# Patient Record
Sex: Female | Born: 1995 | Race: White | Hispanic: Yes | Marital: Single
Health system: Southern US, Community
[De-identification: ages and names within clinical notes are randomized; demographics above are authoritative.]

## PROBLEM LIST (undated history)

## (undated) DIAGNOSIS — T7840XA Allergy, unspecified, initial encounter: Secondary | ICD-10-CM

## (undated) DIAGNOSIS — J45909 Unspecified asthma, uncomplicated: Secondary | ICD-10-CM

## (undated) HISTORY — DX: Unspecified asthma, uncomplicated: J45.909

## (undated) HISTORY — DX: Allergy, unspecified, initial encounter: T78.40XA

---
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
## 2016-01-28 ENCOUNTER — Telehealth: Payer: Self-pay | Admitting: Family Medicine

## 2016-01-29 NOTE — Telephone Encounter (Signed)
Pt aware to come pick up labs

## 2017-04-22 ENCOUNTER — Telehealth: Payer: Self-pay | Admitting: Family Medicine

## 2017-04-22 NOTE — Telephone Encounter (Signed)
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 Appt requested with Dr Darlyn ReadStacks because that is who mother is seeing as well. Appt given

## 2017-04-24 ENCOUNTER — Ambulatory Visit (INDEPENDENT_AMBULATORY_CARE_PROVIDER_SITE_OTHER): Admitting: Family Medicine

## 2017-04-24 ENCOUNTER — Encounter: Payer: Self-pay | Admitting: Family Medicine

## 2017-04-24 VITALS — BP 115/79 | HR 83 | Temp 98.8°F | Ht 65.7 in | Wt 128.0 lb

## 2017-04-24 DIAGNOSIS — B89 Unspecified parasitic disease: Secondary | ICD-10-CM | POA: Diagnosis not present

## 2017-04-24 MED ORDER — ALBENDAZOLE 200 MG PO TABS: 400.0000 mg | ORAL_TABLET | Freq: Two times a day (BID) | ORAL | 0 refills | Status: DC

## 2017-04-24 NOTE — Progress Notes (Signed)
Chief Complaint  Patient presents with  . Advice Only    pt here today to discuss parasites as her mother thinks everyone in the house is infested     HPI  Patient presents today for Questionable parasite infection. She was recently noted to have deficiency of vitamins. This was through blood work done at her rheumatologist's office. Additionally there is a known infestation of parasites in the soil of [place] on [place] she and [place] family live. She admits to going barefoot in that area routinely and also digging in the dirt plant flowers etc. She has a history of some irritable bowel symptoms in the past.  PMH: Smoking status noted ROS: Per HPI  Objective: BP 115/79   Pulse 83   Temp 98.8 F (37.1 C) (Oral)   Ht 5' 5.7" (1.669 m)   Wt 128 lb (58.1 kg)   BMI 20.85 kg/m  Gen: NAD, alert, cooperative with exam HEENT: NCAT, EOMI, PERRL CV: RRR, good S1/S2, no murmur Resp: CTABL, no wheezes, non-labored Abd: SNTND, BS present, no guarding or organomegaly Ext: No edema, warm Neuro: Alert and oriented, No gross deficits  Assessment and plan:  1. Parasite infestation     Meds ordered this encounter  Medications  . gabapentin (NEURONTIN) 300 MG capsule    Sig: Take 300 mg in AM and 300 mg in afternoon and 600 mg before bed  . drospirenone-ethinyl estradiol (YASMIN 28) 3-0.03 MG tablet    Sig: drospirenone 3 mg-ethinyl estradiol 0.03 mg tablet  TAKE 1 TABLET BY MOUTH EVERY DAY  . DISCONTD: albendazole (ALBENZA) 200 MG tablet    Sig: Take 2 tablets (400 mg total) by mouth 2 (two) times daily.    Dispense:  84 tablet    Refill:  0  . albendazole (ALBENZA) 200 MG tablet    Sig: Take 2 tablets (400 mg total) by mouth 2 (two) times daily.    Dispense:  84 tablet    Refill:  0    Orders Placed This Encounter  Procedures  . Giardia/Cryptosporidium EIA  . Cdiff NAA+O+P+Stool Culture    Follow up as needed.  Mechele ClaudeWarren Cherrise Occhipinti, MD

## 2017-04-28 ENCOUNTER — Telehealth: Payer: Self-pay | Admitting: Family Medicine

## 2017-04-28 NOTE — Telephone Encounter (Signed)
She found the papers. Disregard this message

## 2017-05-04 ENCOUNTER — Telehealth: Payer: Self-pay | Admitting: *Deleted

## 2017-05-05 MED ORDER — ALBENDAZOLE 200 MG PO TABS: 400.0000 mg | ORAL_TABLET | Freq: Once | ORAL | 0 refills | Status: AC

## 2017-05-05 NOTE — Telephone Encounter (Signed)
Mom says [place], very worried some of ongoing symptoms related. Will send in albendazole 400mg  x 1. Needs to be seen for any further treatment.

## 2017-05-06 ENCOUNTER — Telehealth: Payer: Self-pay | Admitting: Pediatrics

## 2017-05-07 MED ORDER — ALBENDAZOLE 200 MG PO TABS: 400.0000 mg | ORAL_TABLET | Freq: Once | ORAL | 0 refills | Status: AC

## 2017-05-07 NOTE — Telephone Encounter (Signed)
Meds by Mail [facility] will cover albenzadol Please send in Rx.  Mom not sure of # of days of treatment for each family member

## 2017-05-07 NOTE — Telephone Encounter (Signed)
Take albendazole 400mg once as I discussed with mom on phone 8/7. I printed Rx. 

## 2017-05-08 ENCOUNTER — Telehealth: Payer: Self-pay | Admitting: Family Medicine

## 2017-05-11 NOTE — Telephone Encounter (Signed)
FYI, not sure what to do next 

## 2017-05-11 NOTE — Telephone Encounter (Signed)
On hold pending verification of medication name

## 2017-05-14 NOTE — Telephone Encounter (Signed)
Spoke with [facility] and verified [facility] Dodgelbenza rx so they should be getting it ready for shipping out.

## 2017-08-24 ENCOUNTER — Telehealth: Payer: Self-pay | Admitting: Family Medicine

## 2017-08-24 NOTE — Telephone Encounter (Signed)
What type of referral do you need? Dr. Loleta ChanceHill, [facility]  Have you been seen at our office for this problem? Yes by Dr. Darlyn ReadStacks discussed nuerological issues last time she was seen (If no, schedule them an appointment.  They will need to be seen before a referral can be done.)  Is there a particular doctor or location that you prefer? Dr. Isabell JarvisEd Hill  Patient notified that referrals can take up to a week or longer to process. If they haven't heard anything within a week they should call back and speak with the referral department.

## 2017-08-24 NOTE — Telephone Encounter (Signed)
Please address

## 2017-08-25 ENCOUNTER — Other Ambulatory Visit: Payer: Self-pay

## 2017-08-25 DIAGNOSIS — E569 Vitamin deficiency, unspecified: Secondary | ICD-10-CM

## 2017-08-25 NOTE — Telephone Encounter (Signed)
She just needs a follow up appt wit him. WS

## 2017-10-27 ENCOUNTER — Telehealth: Payer: Self-pay

## 2017-10-27 NOTE — Telephone Encounter (Signed)
That's okay with me if you know someone. Perhaps Dr. Christell ConstantMoore knows someone. Thanks, WS

## 2017-10-27 NOTE — Telephone Encounter (Signed)
Patient does not want referral to Dr Loleta ChanceHill (Neurology)  [place] a [place]
